# Patient Record
Sex: Male | Born: 1980 | Race: Black or African American | Hispanic: No | Marital: Married | Smoking: Never smoker
Health system: Southern US, Community
[De-identification: ages and names within clinical notes are randomized; demographics above are authoritative.]

## PROBLEM LIST (undated history)

## (undated) DIAGNOSIS — R079 Chest pain, unspecified: Secondary | ICD-10-CM

## (undated) DIAGNOSIS — Z8249 Family history of ischemic heart disease and other diseases of the circulatory system: Secondary | ICD-10-CM

## (undated) HISTORY — DX: Chest pain, unspecified: R07.9

---
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
## 2012-10-09 ENCOUNTER — Other Ambulatory Visit: Payer: Self-pay | Admitting: Cardiology

## 2012-10-09 DIAGNOSIS — I2510 Atherosclerotic heart disease of native coronary artery without angina pectoris: Secondary | ICD-10-CM

## 2012-10-13 ENCOUNTER — Other Ambulatory Visit: Payer: Self-pay

## 2012-10-13 ENCOUNTER — Ambulatory Visit
Admission: RE | Admit: 2012-10-13 | Discharge: 2012-10-13 | Disposition: A | Discharge status: Home / Self Care | Payer: Self-pay | Source: Ambulatory Visit | Attending: Cardiology | Admitting: Cardiology

## 2012-10-13 DIAGNOSIS — I2510 Atherosclerotic heart disease of native coronary artery without angina pectoris: Secondary | ICD-10-CM

## 2013-03-06 ENCOUNTER — Emergency Department (HOSPITAL_COMMUNITY)
Admission: EM | Admit: 2013-03-06 | Discharge: 2013-03-06 | Disposition: A | Discharge status: Home / Self Care | Payer: BC Managed Care – PPO | Attending: Emergency Medicine | Admitting: Emergency Medicine

## 2013-03-06 ENCOUNTER — Encounter (HOSPITAL_COMMUNITY): Payer: Self-pay

## 2013-03-06 DIAGNOSIS — Z79899 Other long term (current) drug therapy: Secondary | ICD-10-CM | POA: Insufficient documentation

## 2013-03-06 DIAGNOSIS — Z7982 Long term (current) use of aspirin: Secondary | ICD-10-CM | POA: Insufficient documentation

## 2013-03-06 DIAGNOSIS — S058X9A Other injuries of unspecified eye and orbit, initial encounter: Secondary | ICD-10-CM | POA: Insufficient documentation

## 2013-03-06 DIAGNOSIS — IMO0002 Reserved for concepts with insufficient information to code with codable children: Secondary | ICD-10-CM | POA: Insufficient documentation

## 2013-03-06 DIAGNOSIS — Y9389 Activity, other specified: Secondary | ICD-10-CM | POA: Insufficient documentation

## 2013-03-06 DIAGNOSIS — Y9289 Other specified places as the place of occurrence of the external cause: Secondary | ICD-10-CM | POA: Insufficient documentation

## 2013-03-06 DIAGNOSIS — S0502XA Injury of conjunctiva and corneal abrasion without foreign body, left eye, initial encounter: Secondary | ICD-10-CM

## 2013-03-06 MED ORDER — FLUORESCEIN SODIUM 1 MG OP STRP
ORAL_STRIP | OPHTHALMIC | Status: AC
  Filled 2013-03-06: qty 1

## 2013-03-06 MED ORDER — TETRACAINE HCL 0.5 % OP SOLN
2.0000 [drp] | Freq: Once | OPHTHALMIC | Status: AC
Start: 2013-03-06 — End: 2013-03-06
  Administered 2013-03-06: 2 [drp] via OPHTHALMIC
  Filled 2013-03-06: qty 2

## 2013-03-06 MED ORDER — FLUORESCEIN SODIUM 1 MG OP STRP
ORAL_STRIP | OPHTHALMIC | Status: AC
  Administered 2013-03-06: 10:00:00
  Filled 2013-03-06: qty 1

## 2013-03-06 NOTE — ED Notes (Signed)
Patient cutting tree and was hit in the eye with a tree limb.

## 2013-03-06 NOTE — ED Notes (Signed)
ION:[id number]<MW> Expected date:<BR> Expected time:<BR> Means of arrival:<BR> Comments:<BR> Ems/ was seen here yesterday

## 2013-03-06 NOTE — ED Provider Notes (Signed)
History     CSN: [id number]  Arrival date & time 03/06/13  7564   First MD Initiated Contact with Patient 03/06/13 [phone]      Chief Complaint  Patient presents with  . Eye Pain    (Consider location/radiation/quality/duration/timing/severity/associated sxs/prior treatment) HPI Comments: Was cutting tree limb, another branch snapped back and struck him in the left eye.  The eye feels sore today.  No blurry vision or visual disturbances.    Patient is a 32 y.o. male presenting with eye pain. The history is provided by the patient.  Eye Pain This is a new problem. The current episode started yesterday. The problem occurs constantly. The problem has not changed since onset.Exacerbated by: bright lights. Nothing relieves the symptoms. He has tried nothing for the symptoms. The treatment provided no relief.    History reviewed. No pertinent past medical history.  History reviewed. No pertinent past surgical history.  No family history on file.  History  Substance Use Topics  . Smoking status: Never Smoker   . Smokeless tobacco: Never Used  . Alcohol Use: No      Review of Systems  Eyes: Positive for pain.  All other systems reviewed and are negative.    Allergies  Review of patient's allergies indicates no known allergies.  Home Medications   Current Outpatient Rx  Name  Route  Sig  Dispense  Refill  . aspirin EC 81 MG tablet   Oral   Take 81 mg by mouth every other day.         . fish oil-omega-3 fatty acids 1000 MG capsule   Oral   Take 1 g by mouth once a week.           BP 141/89  Temp(Src) 98.6 F (37 C) (Oral)  Resp 18  SpO2 100%  Physical Exam  Nursing note and vitals reviewed. Constitutional: He is oriented to person, place, and time. He appears well-developed and well-nourished. No distress.  HENT:  Head: Normocephalic and atraumatic.  Mouth/Throat: Oropharynx is clear and moist.  Eyes:  He is able to hold both eyes open without any  apparent discomfort.  The left eye is noted to have conjunctival injection, but no obvious abrasions or lacerations.  The pupils are equally round and there is no sign of hyphema.    There are slight superficial abrasions to the left cornea with fluorescein staining.    Neck: Normal range of motion. Neck supple.  Neurological: He is alert and oriented to person, place, and time.  Skin: Skin is warm and dry. He is not diaphoretic.    ED Course  Procedures (including critical care time)  Labs Reviewed - No data to display No results found.   No diagnosis found.    MDM  No visual complaints and no evidence for hyphema or serious injury.  No specific treatment indicated.  Return prn.        Geoffery Lyons, MD 03/06/13 1012

## 2019-03-17 ENCOUNTER — Ambulatory Visit
Admission: RE | Admit: 2019-03-17 | Discharge: 2019-03-17 | Disposition: A | Discharge status: Home / Self Care | Payer: 59 | Source: Ambulatory Visit | Attending: Chiropractic Medicine | Admitting: Chiropractic Medicine

## 2019-03-17 ENCOUNTER — Other Ambulatory Visit: Payer: Self-pay | Admitting: Chiropractic Medicine

## 2019-03-17 ENCOUNTER — Other Ambulatory Visit: Payer: Self-pay

## 2019-03-17 DIAGNOSIS — G8929 Other chronic pain: Secondary | ICD-10-CM

## 2019-03-17 DIAGNOSIS — M25512 Pain in left shoulder: Secondary | ICD-10-CM

## 2019-03-17 DIAGNOSIS — M25552 Pain in left hip: Secondary | ICD-10-CM

## 2019-04-09 DIAGNOSIS — R079 Chest pain, unspecified: Secondary | ICD-10-CM | POA: Insufficient documentation

## 2019-04-10 ENCOUNTER — Ambulatory Visit (INDEPENDENT_AMBULATORY_CARE_PROVIDER_SITE_OTHER): Payer: 59 | Admitting: Cardiology

## 2019-04-10 ENCOUNTER — Encounter: Payer: Self-pay | Admitting: Cardiology

## 2019-04-10 ENCOUNTER — Other Ambulatory Visit: Payer: Self-pay

## 2019-04-10 VITALS — BP 135/80 | HR 62 | Ht 78.0 in | Wt 259.1 lb

## 2019-04-10 DIAGNOSIS — Z8249 Family history of ischemic heart disease and other diseases of the circulatory system: Secondary | ICD-10-CM | POA: Diagnosis not present

## 2019-04-10 DIAGNOSIS — R55 Syncope and collapse: Secondary | ICD-10-CM | POA: Diagnosis not present

## 2019-04-10 DIAGNOSIS — E785 Hyperlipidemia, unspecified: Secondary | ICD-10-CM | POA: Diagnosis not present

## 2019-04-10 DIAGNOSIS — R030 Elevated blood-pressure reading, without diagnosis of hypertension: Secondary | ICD-10-CM

## 2019-04-10 HISTORY — DX: Family history of ischemic heart disease and other diseases of the circulatory system: Z82.49

## 2019-04-10 NOTE — Progress Notes (Signed)
Primary Physician/Referring:  Gwenlyn FoundEksir, Samantha A, MD  Patient ID: Bryan Castaneda, male    DOB: [birth year], 38 y.o.   MRN: [id number]  Chief Complaint  Patient presents with  . Loss of Consciousness  . Dizziness  . Shortness of Breath  . New Patient (Initial Visit)   HPI:    HPI: Bryan Castaneda  is a 38 y.o. Patient with no significant prior cardiovascular history, family history of questionable coronary and cerebral vascular disease, had an episode of syncope while he was playing with his children in the yard, he bent over and felt extremely dizzy.  He went about doing this activity, he wants to see if could reproduce symptoms again and he bent down again and he stood up and then had an episode of syncope.  His wife who heard the noise was very close by and saw him on the ground but he was able to get up and go about doing this activity without any further problems.  Hence he wanted to come and see me.  Otherwise remains asymptomatic, continues to walk and jog for about 5 miles a day.  Past Medical History:  Diagnosis Date  . Chest pain   . Family history of ischemic heart disease 04/10/2019   History reviewed. No pertinent surgical history. Social History   Socioeconomic History  . Marital status: Married    Spouse name: Not on file  . Number of children: 2  . Years of education: Not on file  . Highest education level: Not on file  Occupational History  . Not on file  Social Needs  . Financial resource strain: Not on file  . Food insecurity    Worry: Not on file    Inability: Not on file  . Transportation needs    Medical: Not on file    Non-medical: Not on file  Tobacco Use  . Smoking status: Never Smoker  . Smokeless tobacco: Never Used  Substance and Sexual Activity  . Alcohol use: Yes    Comment: occ  . Drug use: No  . Sexual activity: Not on file  Lifestyle  . Physical activity    Days per week: Not on file    Minutes per session: Not on file  . Stress: Not on  file  Relationships  . Social Musicianconnections    Talks on phone: Not on file    Gets together: Not on file    Attends religious service: Not on file    Active member of club or organization: Not on file    Attends meetings of clubs or organizations: Not on file    Relationship status: Not on file  . Intimate partner violence    Fear of current or ex partner: Not on file    Emotionally abused: Not on file    Physically abused: Not on file    Forced sexual activity: Not on file  Other Topics Concern  . Not on file  Social History Narrative  . Not on file   ROS  Review of Systems  Constitution: Negative for chills, decreased appetite, malaise/fatigue and weight gain.  Cardiovascular: Negative for dyspnea on exertion, leg swelling and syncope.  Endocrine: Negative for cold intolerance.  Hematologic/Lymphatic: Does not bruise/bleed easily.  Musculoskeletal: Negative for joint swelling.  Gastrointestinal: Negative for abdominal pain, anorexia, change in bowel habit, hematochezia and melena.  Neurological: Negative for headaches and light-headedness.  Psychiatric/Behavioral: Negative for depression and substance abuse.  All other systems reviewed and are negative.  Objective  Blood pressure 135/80, pulse 62, height 6\' 6"  (1.981 m), weight 259 lb 1.6 oz (117.5 kg), SpO2 99 %. Body mass index is 29.94 kg/m.  Older Vitals  04/10/19 10:15 AM  04/10/19 10:16 AM  04/10/19 10:30 AM    BP    135/80   BP  130/87  127/92    BP Location  RightArm  RightArm    Patient Position  Sitting  Standing    Cuff Size  Normal  Normal    Pulse    62   Pulse  76  84    SpO2  97%  99%    Other Vitals  BMI 29.94 kg/m2  BSA 2.54 m2  Tobacco  Smoking Status Never Smoker  Smokeless Status Never Used  Reviewed 04/10/2019       Physical Exam  Constitutional: He appears well-developed and well-nourished. No distress.  Overweight  HENT:  Head: Atraumatic.  Eyes: Conjunctivae are normal.   Neck: Neck supple. No JVD present. No thyromegaly present.  Cardiovascular: Normal rate, regular rhythm, normal heart sounds and intact distal pulses. Exam reveals no gallop.  No murmur heard. Pulmonary/Chest: Effort normal and breath sounds normal.  Abdominal: Soft. Bowel sounds are normal.  Musculoskeletal: Normal range of motion.  Neurological: He is alert.  Skin: Skin is warm and dry.  Psychiatric: He has a normal mood and affect.   Radiology: No results found.  Laboratory examination:  Hemoglobin A1CResulted: 03/13/2018 2:07 PM [facility] Component Name Value Ref Range  HEMOGLOBIN A1C 5.5  Comment:  Normal:       Less than 5.7% Prediabetes:  5.7% to 6.4% Diabetes:     Greater than 6.4% <5.7 %   Lipid ProfileResulted: 03/13/2018 1:48 PM [facility] Component Name Value Ref Range  LDL Direct 136 <150 mg/dL  Total Cholesterol 207 (H) 25 - 199 MG/DL  Triglycerides 48 10 - 150 MG/DL  HDL Cholesterol 70 35 - 135 MG/DL  Total Chol / HDL Cholesterol 3.0 <=4.5   Non-HDL Cholesterol 137  Comment:   TARGET: <(LDL-C TARGET + 30)MG/DL MG/DL   No flowsheet data found. No flowsheet data found. Lipid Panel  No results found for: CHOL, TRIG, HDL, CHOLHDL, VLDL, LDLCALC, LDLDIRECT HEMOGLOBIN A1C No results found for: HGBA1C, MPG TSH No results for input(s): TSH in the last 8760 hours. Medications   Current Outpatient Medications  Medication Instructions  . fish oil-omega-3 fatty acids 1 g, Weekly    Cardiac Studies:   None in past 3-4 years  Assessment     ICD-10-CM   1. Vasovagal syncope  R55 EKG 12-Lead  2. Family history of ischemic heart disease  Z59.49    Father died at age 6 Y -[place], do not know cause. Brother stroke in 44s (obese and HTN), Sister mid 30s ??sudden cardiac death.  3. Elevated BP without diagnosis of hypertension  R03.0   4. Mild hyperlipidemia  E78.5     04/10/2019: Normal sinus rhythm at rate of 63  bpm, normal axis.  Incomplete right bundle branch block.  No evidence of ischemia, early repolarization noted.  Normal EKG.  Recommendations:   Patient with no significant prior cardiac vascular history, presenting with syncope.  Syncope appears vasovagal episode.  Do not suspect any cardiac issues, continues to remain active walking at least 4-5 miles a day.  I reviewed his labs from his PCP, he does have mild hyperlipidemia and I have discussed low-fat diet and weight  loss.  His BMI is 29+, weight loss would help in improving his lipids as well as elevated blood pressure.  I suspect he will probably need therapy for hypertension, advised him to follow-up with his PCP to follow-up on blood pressure issues as well.  I do not think he needs any cardiac evaluation at this point, I simply reassured him.  Yates DecampJay Janissa Bertram, MD, [facility] CorporationFACC 04/13/2019, 9:37 AM [facility] [place] Pager: [phone] Office: [phone] If no answer Cell [phone]

## 2019-05-13 ENCOUNTER — Other Ambulatory Visit: Payer: Self-pay

## 2019-05-13 ENCOUNTER — Ambulatory Visit: Payer: 59

## 2019-05-13 ENCOUNTER — Ambulatory Visit (INDEPENDENT_AMBULATORY_CARE_PROVIDER_SITE_OTHER): Payer: 59 | Admitting: Cardiology

## 2019-05-13 ENCOUNTER — Encounter: Payer: Self-pay | Admitting: Cardiology

## 2019-05-13 VITALS — Temp 99.0°F | Ht 75.0 in | Wt 247.0 lb

## 2019-05-13 DIAGNOSIS — R55 Syncope and collapse: Secondary | ICD-10-CM

## 2019-05-13 DIAGNOSIS — Z8249 Family history of ischemic heart disease and other diseases of the circulatory system: Secondary | ICD-10-CM | POA: Diagnosis not present

## 2019-05-13 NOTE — Progress Notes (Signed)
Primary Physician/Referring:  Gwenlyn FoundEksir, Samantha A, MD  Patient ID: Bryan Castaneda, male    DOB: November 22, 1980, 38 y.o.   MRN: [id number]  Chief Complaint  Patient presents with  . Loss of Consciousness    f/u    HPI:    HPI: Bryan Castaneda  is a 38 y.o. Patient with no significant prior cardiovascular history, family history of questionable coronary and cerebral vascular disease, had an episode of syncope while he was playing with his children in the yard, he bent over and felt extremely dizzy.  He went about doing this activity, he wants to see if could reproduce symptoms again and he bent down again and he stood up and then had an episode of syncope.  His wife who heard the noise was very close by and saw him on the ground but he was able to get up and go about doing this activity without any further problems.    He was evaluated by me on 04/10/2019, I felt it was vaso-vagal/neurocardiogenic syncope.  However patient was reevaluated by his PCP Dr. Brett FairySamantha Eksir on 05/13/2019, patient had another episode of syncope while walking up the stairs, hence was referred back to me for evaluation.    He is very active and healthy and told to have mild hyperlipidemia and has had a coronary calcium score of 0 in 2014. Otherwise remains asymptomatic, continues to walk and jog for about 5 miles a day.   On questioning, he again had premonitory symptoms of dizziness prior to syncope.  Past Medical History:  Diagnosis Date  . Chest pain   . Family history of ischemic heart disease 04/10/2019   No past surgical history on file. Social History   Socioeconomic History  . Marital status: Married    Spouse name: Not on file  . Number of children: 2  . Years of education: Not on file  . Highest education level: Not on file  Occupational History  . Not on file  Social Needs  . Financial resource strain: Not on file  . Food insecurity    Worry: Not on file    Inability: Not on file  . Transportation needs     Medical: Not on file    Non-medical: Not on file  Tobacco Use  . Smoking status: Never Smoker  . Smokeless tobacco: Never Used  Substance and Sexual Activity  . Alcohol use: Yes    Comment: occ  . Drug use: No  . Sexual activity: Not on file  Lifestyle  . Physical activity    Days per week: Not on file    Minutes per session: Not on file  . Stress: Not on file  Relationships  . Social Musicianconnections    Talks on phone: Not on file    Gets together: Not on file    Attends religious service: Not on file    Active member of club or organization: Not on file    Attends meetings of clubs or organizations: Not on file    Relationship status: Not on file  . Intimate partner violence    Fear of current or ex partner: Not on file    Emotionally abused: Not on file    Physically abused: Not on file    Forced sexual activity: Not on file  Other Topics Concern  . Not on file  Social History Narrative  . Not on file   ROS  Review of Systems  Constitution: Negative for chills, decreased appetite, malaise/fatigue and  weight gain.  Cardiovascular: Negative for dyspnea on exertion, leg swelling and syncope.  Endocrine: Negative for cold intolerance.  Hematologic/Lymphatic: Does not bruise/bleed easily.  Musculoskeletal: Negative for joint swelling.  Gastrointestinal: Negative for abdominal pain, anorexia, change in bowel habit, hematochezia and melena.  Neurological: Negative for headaches and light-headedness.  Psychiatric/Behavioral: Negative for depression and substance abuse.  All other systems reviewed and are negative.  Objective  Temperature 99 F (37.2 C), height 6\' 3"  (1.905 m), weight 247 lb (112 kg), SpO2 100 %. Body mass index is 30.87 kg/m.  Flowsheets     05/13/19 1:52 PM  05/13/19 1:58 PM  05/13/19 1:59 PM   BP  114/73  118/78  105/81   BP Location  LeftArm  LeftArm  LeftArm   Patient Position  Supine  Sitting  Standing   Cuff Size  Normal  Normal   Normal   Pulse  59  70  85   Temp  24F(37.2C)     SpO2  96%  100%  100%   Weight  247lb(112kg)     Height  6'3"(1.963m)         Physical Exam  Constitutional: He appears well-developed and well-nourished. No distress.  Overweight  HENT:  Head: Atraumatic.  Eyes: Conjunctivae are normal.  Neck: Neck supple. No JVD present. No thyromegaly present.  Cardiovascular: Normal rate, regular rhythm, normal heart sounds and intact distal pulses. Exam reveals no gallop.  No murmur heard. Pulmonary/Chest: Effort normal and breath sounds normal.  Abdominal: Soft. Bowel sounds are normal.  Musculoskeletal: Normal range of motion.  Neurological: He is alert.  Skin: Skin is warm and dry.  Psychiatric: He has a normal mood and affect.   Radiology: No results found.  Laboratory examination:  Hemoglobin A1CResulted: 03/13/2018 2:07 PM [facility] Component Name Value Ref Range  HEMOGLOBIN A1C 5.5  Comment:  Normal:       Less than 5.7% Prediabetes:  5.7% to 6.4% Diabetes:     Greater than 6.4% <5.7 %   Lipid ProfileResulted: 03/13/2018 1:48 PM [facility] Component Name Value Ref Range  LDL Direct 136 <150 mg/dL  Total Cholesterol 364 (H) 25 - 199 MG/DL  Triglycerides 48 10 - 150 MG/DL  HDL Cholesterol 70 35 - 135 MG/DL  Total Chol / HDL Cholesterol 3.0 <=4.5   Non-HDL Cholesterol 137  Comment:   TARGET: <(LDL-C TARGET + 30)MG/DL MG/DL   Medications   Current Outpatient Medications  Medication Instructions  . fish oil-omega-3 fatty acids 1 g, Weekly    Cardiac Studies:   None in past 3-4 years (Negative GXT in 2012 and normal echo)  Assessment     ICD-10-CM   1. Vasovagal syncope  R55 PCV ECHOCARDIOGRAM COMPLETE    CARDIAC EVENT MONITOR  2. Family history of ischemic heart disease  Z82.49     04/10/2019: Normal sinus rhythm at rate of 63 bpm, normal axis.  Incomplete right bundle branch block.  No evidence of  ischemia, early repolarization noted.  Normal EKG.  Recommendations:   Patient was referred back to me for recurrence of syncope.  On further questioning, patient has had syncope after he walked up a flight of stairs, felt dizzy, felt slightly diaphoretic and had syncope, no injury.  All the episodes that he has had clearly had premonitory symptoms of dizziness followed by event.  No injury, has never happened while he is driving or while he is actual or he is running.  Physical examination is completely normal. I will set up for a Event/Holter monitor for 30 days days. Explained how to use it and to activate the device. Will schedule for an echocardiogram. Office visit following the work-up/investigations.   Adrian Prows, MD, [facility] 05/13/2019, 5:58 PM [facility]. [place] Pager: [phone] Office: [phone] If no answer Cell [phone]

## 2019-05-22 ENCOUNTER — Other Ambulatory Visit: Payer: Self-pay

## 2019-05-22 ENCOUNTER — Ambulatory Visit (INDEPENDENT_AMBULATORY_CARE_PROVIDER_SITE_OTHER): Payer: 59

## 2019-05-22 DIAGNOSIS — R55 Syncope and collapse: Secondary | ICD-10-CM

## 2019-06-26 ENCOUNTER — Encounter: Payer: Self-pay | Admitting: Cardiology

## 2019-06-26 ENCOUNTER — Ambulatory Visit (INDEPENDENT_AMBULATORY_CARE_PROVIDER_SITE_OTHER): Payer: 59 | Admitting: Cardiology

## 2019-06-26 ENCOUNTER — Other Ambulatory Visit: Payer: Self-pay

## 2019-06-26 VITALS — BP 123/94 | HR 78 | Temp 96.9°F | Ht 78.0 in | Wt 245.0 lb

## 2019-06-26 DIAGNOSIS — R030 Elevated blood-pressure reading, without diagnosis of hypertension: Secondary | ICD-10-CM

## 2019-06-26 DIAGNOSIS — R55 Syncope and collapse: Secondary | ICD-10-CM | POA: Diagnosis not present

## 2019-06-26 DIAGNOSIS — E785 Hyperlipidemia, unspecified: Secondary | ICD-10-CM | POA: Diagnosis not present

## 2019-06-26 NOTE — Progress Notes (Signed)
Primary Physician/Referring:  Gwenlyn FoundEksir, Samantha A, MD  Patient ID: Bryan Castaneda, male    DOB: 12-27-1980, 38 y.o.   MRN: [id number]  Chief Complaint  Patient presents with  . Vasovagal syncope  . Follow-up    Vasovagal syncope and event monitor   HPI:    HPI: Bryan Mossesbenezer Pesola  is a 38 y.o. Patient with no significant prior cardiovascular history, family history of questionable coronary and cerebral vascular disease, had an episode of syncope while he was playing with his children in the yard, he bent over and felt extremely dizzy.  He went about doing this activity, he wants to see if could reproduce symptoms again and he bent down again and he stood up and then had an episode of syncope.  His wife who heard the noise was very close by and saw him on the ground but he was able to get up and go about doing this activity without any further problems.  He has premonitory symptoms of dizziness prior to syncope.  He was evaluated by me on 04/10/2019, I felt it was vaso-vagal/neurocardiogenic syncope.  However patient was reevaluated by his PCP Dr. Brett FairySamantha Eksir on 05/13/2019, patient had another episode of syncope while walking up the stairs, hence was referred back to me for evaluation.  He underwent event monitor and echo and presents for f/u. Had near syncope during past one month, but no episodes in last 3 weeks to 4 weeks. Events were recorded on the monitor.   He is very active and healthy and told to have mild hyperlipidemia and has had a coronary calcium score of 0 in 2014. Otherwise remains asymptomatic, continues to walk and jog for about 5 miles a day.   Past Medical History:  Diagnosis Date  . Chest pain   . Family history of ischemic heart disease 04/10/2019   History reviewed. No pertinent surgical history. Social History   Socioeconomic History  . Marital status: Married    Spouse name: Not on file  . Number of children: 2  . Years of education: Not on file  . Highest education  level: Not on file  Occupational History  . Not on file  Social Needs  . Financial resource strain: Not on file  . Food insecurity    Worry: Not on file    Inability: Not on file  . Transportation needs    Medical: Not on file    Non-medical: Not on file  Tobacco Use  . Smoking status: Never Smoker  . Smokeless tobacco: Never Used  Substance and Sexual Activity  . Alcohol use: Yes    Comment: occ  . Drug use: No  . Sexual activity: Not on file  Lifestyle  . Physical activity    Days per week: Not on file    Minutes per session: Not on file  . Stress: Not on file  Relationships  . Social Musicianconnections    Talks on phone: Not on file    Gets together: Not on file    Attends religious service: Not on file    Active member of club or organization: Not on file    Attends meetings of clubs or organizations: Not on file    Relationship status: Not on file  . Intimate partner violence    Fear of current or ex partner: Not on file    Emotionally abused: Not on file    Physically abused: Not on file    Forced sexual activity: Not on file  Other Topics Concern  . Not on file  Social History Narrative  . Not on file   ROS  Review of Systems  Constitution: Negative for chills, decreased appetite, malaise/fatigue and weight gain.  Cardiovascular: Negative for dyspnea on exertion, leg swelling and syncope.  Endocrine: Negative for cold intolerance.  Hematologic/Lymphatic: Does not bruise/bleed easily.  Musculoskeletal: Negative for joint swelling.  Gastrointestinal: Negative for abdominal pain, anorexia, change in bowel habit, hematochezia and melena.  Neurological: Negative for headaches and light-headedness.  Psychiatric/Behavioral: Negative for depression and substance abuse.  All other systems reviewed and are negative.  Objective  Blood pressure (!) 123/94, pulse 78, temperature (!) 96.9 F (36.1 C), height 6\' 6"  (1.981 m), weight 245 lb (111.1 kg), SpO2 98 %. Body mass  index is 28.31 kg/m.  Flowsheets     05/13/19 1:52 PM  05/13/19 1:58 PM  05/13/19 1:59 PM   BP  114/73  118/78  105/81   BP Location  LeftArm  LeftArm  LeftArm   Patient Position  Supine  Sitting  Standing   Cuff Size  Normal  Normal  Normal   Pulse  59  70  85   Temp  81F(37.2C)     SpO2  96%  100%  100%   Weight  247lb(112kg)     Height  6'3"(1.919m)         Physical Exam  Constitutional: He appears well-developed and well-nourished. No distress.  Overweight  HENT:  Head: Atraumatic.  Eyes: Conjunctivae are normal.  Neck: Neck supple. No JVD present. No thyromegaly present.  Cardiovascular: Normal rate, regular rhythm, normal heart sounds and intact distal pulses. Exam reveals no gallop.  No murmur heard. Pulmonary/Chest: Effort normal and breath sounds normal.  Abdominal: Soft. Bowel sounds are normal.  Musculoskeletal: Normal range of motion.  Neurological: He is alert.  Skin: Skin is warm and dry.  Psychiatric: He has a normal mood and affect.   Radiology: No results found.  Laboratory examination:  Hemoglobin A1CResulted: 03/13/2018 2:07 PM [facility] Component Name Value Ref Range  HEMOGLOBIN A1C 5.5  Comment:  Normal:       Less than 5.7% Prediabetes:  5.7% to 6.4% Diabetes:     Greater than 6.4% <5.7 %   Lipid ProfileResulted: 03/13/2018 1:48 PM [facility] Component Name Value Ref Range  LDL Direct 136 <150 mg/dL  Total Cholesterol [facility] ([facility]) 25 - 199 MG/DL  Triglycerides 48 10 - 150 MG/DL  HDL Cholesterol 70 35 - 135 MG/DL  Total Chol / HDL Cholesterol 3.0 <=4.5   Non-HDL Cholesterol 137  Comment:   TARGET: <(LDL-C TARGET + 30)MG/DL MG/DL   Medications   Current Outpatient Medications  Medication Instructions  . fish oil-omega-3 fatty acids 1 g, Weekly    Cardiac Studies:   (Negative GXT in 2012 and normal echo)    Cronary calcium score of 0 in 2014.   Echocardiogram  05/22/2019: Left ventricle cavity is normal in size. Moderate concentric hypertrophy of the left ventricle. Normal LV systolic function with EF 55%. Normal global wall motion. Normal diastolic filling pattern. Calculated EF 55%. Left atrial cavity is normal in size. Aneurysmal interatrial septum without 2D or color Doppler evidence of interatrial shunt. Right atrial cavity is mildly dilated. Mild tricuspid regurgitation. Estimated pulmonary artery systolic pressure is 25 mmHg. IVC is dilated with respiratory variation. Estimated RA pressure 8 mmHg.  Event monitor 30 days 05/13/2019: Predominant rhythm is normal sinus rhythm.  Symptomatic transmissions of dizziness and occasional dyspnea reveals normal sinus rhythm.  Asymptomatic brief atrial tachycardia noted on day 24, 06/05/2019.      Assessment     ICD-10-CM   1. Vasovagal syncope  R55   2. Elevated BP without diagnosis of hypertension  R03.0   3. Mild hyperlipidemia  E78.5     04/10/2019: Normal sinus rhythm at rate of 63 bpm, normal axis.  Incomplete right bundle branch block.  No evidence of ischemia, early repolarization noted.  Normal EKG.  Recommendations:   Patient with episodes of syncope or near syncope. All the episodes that he has had clearly had premonitory symptoms of dizziness followed by event.  No injury, has never happened while he is driving or while he is actual or he is running, he exercises fairly regularly.  Review of the event monitor reveals no significant arrhythmias or bradycardia.  Brief atrial tachycardia is not of clinical consequence.  Echocardiogram reveals moderate LVH, it may be related to either hypertension but most probably related to him being fairly active and exercising regularly.  Otherwise echo is normal. I have again discussed with him regarding counter-pressure maneuvers worse during episodes of near syncope.  Physical examination is completely normal. Blood pressure is elevated again today.   Advised him to monitor the blood pressure closely, would have a very low threshold to start him on therapy, in view of vasovagal syncope, he may do well with metoprolol succinate 25-50 mg daily.  He also has mild hyperlipidemia, on his next office visit with his PCP, if lipids are still elevated, would consider starting statin therapy.  Adrian Prows, MD, [facility] 06/27/2019, 1:58 PM [facility] [place] Pager: [phone] Office: [phone] If no answer Cell [phone]

## 2019-06-29 NOTE — Telephone Encounter (Signed)
From patient.

## 2019-12-25 ENCOUNTER — Ambulatory Visit: Payer: 59 | Admitting: Cardiology

## 2020-03-08 IMAGING — CR DG HIP (WITH OR WITHOUT PELVIS) 2-3V LEFT
2 series · 2 of 2 positions shown · non-contrast
Comparison: None.

CLINICAL DATA: Anterolateral left hip pain for the past year. No
known injury.

EXAM:
DG HIP (WITH OR WITHOUT PELVIS) 2-3V LEFT

[w hip ap left]
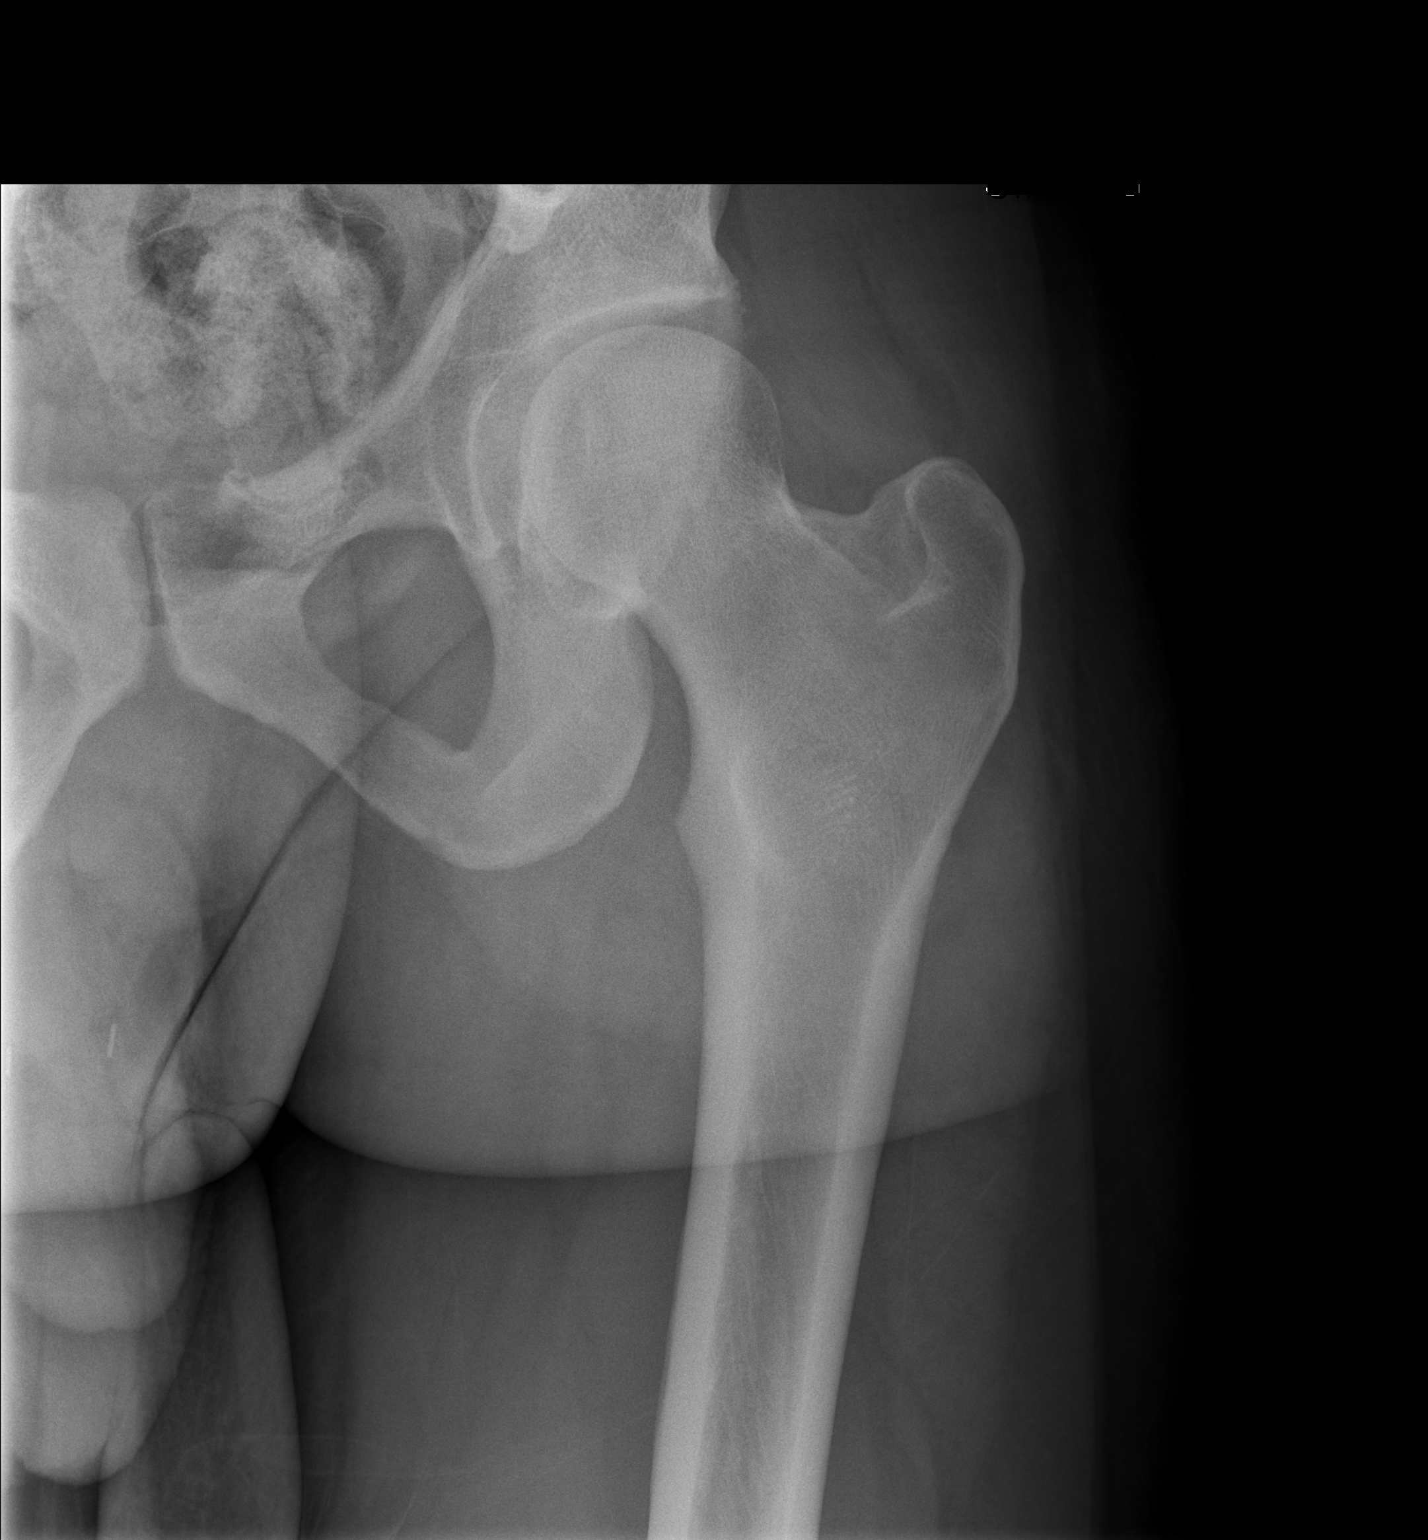

[w hip lat left]
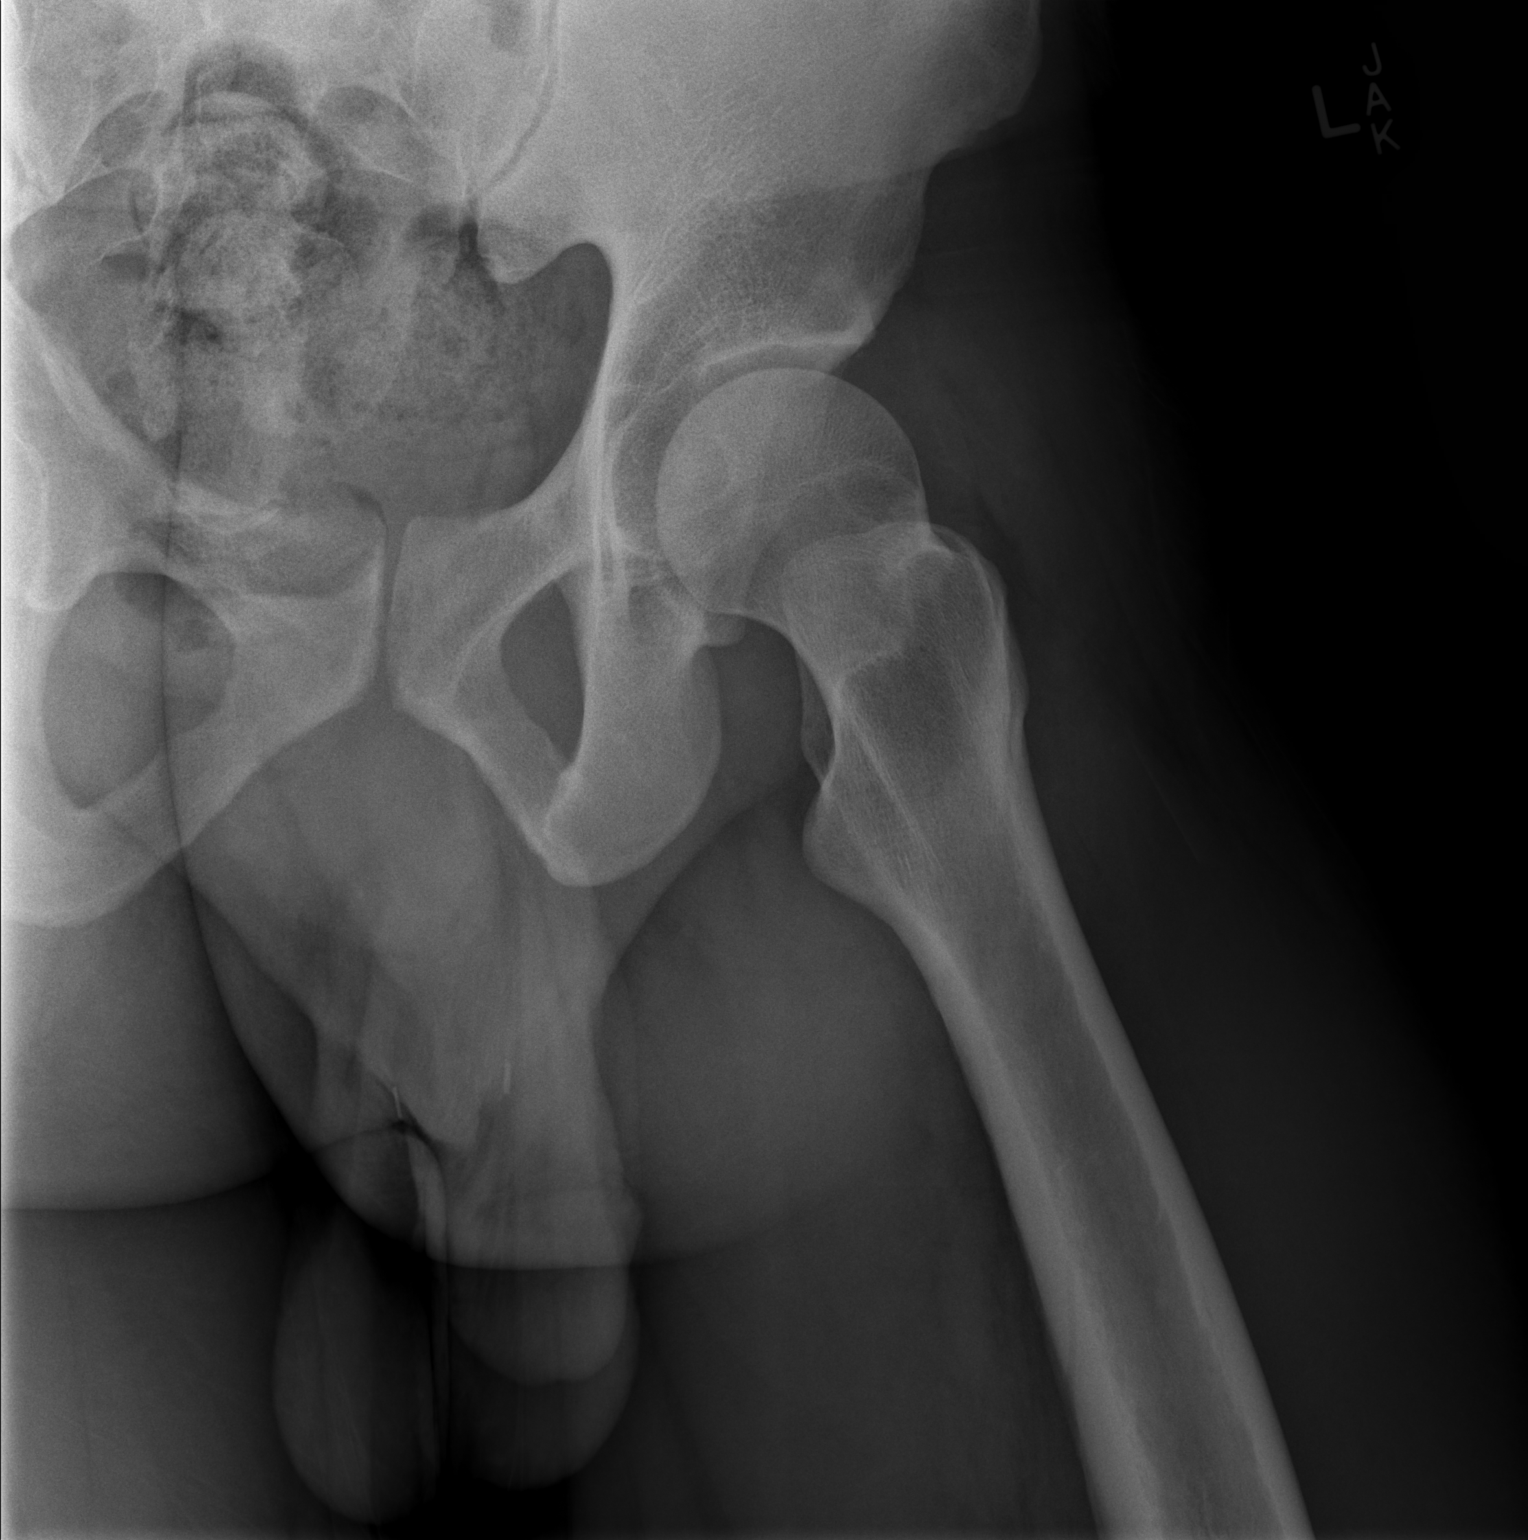

[2 of 2 positions shown; findings below may reference images not displayed]

FINDINGS: No fracture or dislocation. Left hip joint spaces are preserved. No
evidence of avascular necrosis.

Limited visualization of the pelvis is normal. Post cecectomy.
Regional soft tissues appear normal.
IMPRESSION: No explanation for patient's persistent left hip pain.
